# Patient Record
Sex: Female | Born: 1975 | Hispanic: No | Marital: Married | Smoking: Never smoker
Health system: Southern US, Community
[De-identification: ages and names within clinical notes are randomized; demographics above are authoritative.]

## PROBLEM LIST (undated history)

## (undated) DIAGNOSIS — N200 Calculus of kidney: Secondary | ICD-10-CM

## (undated) DIAGNOSIS — A419 Sepsis, unspecified organism: Secondary | ICD-10-CM

## (undated) HISTORY — PX: TONSILLECTOMY: SUR1361

## (undated) HISTORY — PX: URETER SURGERY: SHX823

---

## 2009-01-18 ENCOUNTER — Encounter: Payer: Self-pay | Admitting: Women's Health

## 2009-01-18 ENCOUNTER — Other Ambulatory Visit: Admission: RE | Admit: 2009-01-18 | Discharge: 2009-01-18 | Payer: Self-pay | Admitting: Gynecology

## 2009-01-18 ENCOUNTER — Ambulatory Visit: Payer: Self-pay | Admitting: Women's Health

## 2009-05-10 ENCOUNTER — Ambulatory Visit: Payer: Self-pay | Admitting: Women's Health

## 2015-07-22 ENCOUNTER — Ambulatory Visit: Payer: BLUE CROSS/BLUE SHIELD | Admitting: Pediatrics

## 2017-11-21 ENCOUNTER — Emergency Department (HOSPITAL_BASED_OUTPATIENT_CLINIC_OR_DEPARTMENT_OTHER): Payer: BC Managed Care – PPO

## 2017-11-21 ENCOUNTER — Other Ambulatory Visit: Payer: Self-pay

## 2017-11-21 ENCOUNTER — Encounter (HOSPITAL_BASED_OUTPATIENT_CLINIC_OR_DEPARTMENT_OTHER): Payer: Self-pay

## 2017-11-21 ENCOUNTER — Emergency Department (HOSPITAL_BASED_OUTPATIENT_CLINIC_OR_DEPARTMENT_OTHER)
Admission: EM | Admit: 2017-11-21 | Discharge: 2017-11-21 | Disposition: A | Discharge status: Home / Self Care | Payer: BC Managed Care – PPO | Attending: Emergency Medicine | Admitting: Emergency Medicine

## 2017-11-21 DIAGNOSIS — Y929 Unspecified place or not applicable: Secondary | ICD-10-CM | POA: Insufficient documentation

## 2017-11-21 DIAGNOSIS — Y939 Activity, unspecified: Secondary | ICD-10-CM | POA: Insufficient documentation

## 2017-11-21 DIAGNOSIS — Z79899 Other long term (current) drug therapy: Secondary | ICD-10-CM | POA: Insufficient documentation

## 2017-11-21 DIAGNOSIS — S60221A Contusion of right hand, initial encounter: Secondary | ICD-10-CM | POA: Diagnosis not present

## 2017-11-21 DIAGNOSIS — T07XXXA Unspecified multiple injuries, initial encounter: Secondary | ICD-10-CM

## 2017-11-21 DIAGNOSIS — S60222A Contusion of left hand, initial encounter: Secondary | ICD-10-CM | POA: Insufficient documentation

## 2017-11-21 DIAGNOSIS — S8001XA Contusion of right knee, initial encounter: Secondary | ICD-10-CM | POA: Insufficient documentation

## 2017-11-21 DIAGNOSIS — S8002XA Contusion of left knee, initial encounter: Secondary | ICD-10-CM | POA: Insufficient documentation

## 2017-11-21 DIAGNOSIS — Y999 Unspecified external cause status: Secondary | ICD-10-CM | POA: Insufficient documentation

## 2017-11-21 DIAGNOSIS — S6991XA Unspecified injury of right wrist, hand and finger(s), initial encounter: Secondary | ICD-10-CM | POA: Diagnosis present

## 2017-11-21 HISTORY — DX: Sepsis, unspecified organism: A41.9

## 2017-11-21 HISTORY — DX: Calculus of kidney: N20.0

## 2017-11-21 MED ORDER — CYCLOBENZAPRINE HCL 10 MG PO TABS: 10.0000 mg | ORAL_TABLET | Freq: Two times a day (BID) | ORAL | 0 refills | Status: AC | PRN

## 2017-11-21 MED ORDER — IBUPROFEN 600 MG PO TABS: 600.0000 mg | ORAL_TABLET | Freq: Four times a day (QID) | ORAL | 0 refills | Status: AC | PRN

## 2017-11-21 NOTE — ED Notes (Signed)
Assisted pt with removing ring off of the left fourth finger, gave ring back to pt
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
## 2017-11-21 NOTE — ED Notes (Signed)
Updated to delay
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
## 2019-02-21 ENCOUNTER — Other Ambulatory Visit: Payer: Self-pay | Admitting: Gynecology

## 2019-02-21 DIAGNOSIS — R928 Other abnormal and inconclusive findings on diagnostic imaging of breast: Secondary | ICD-10-CM

## 2019-02-28 ENCOUNTER — Other Ambulatory Visit: Payer: Self-pay

## 2019-02-28 ENCOUNTER — Ambulatory Visit: Payer: BC Managed Care – PPO

## 2019-02-28 ENCOUNTER — Ambulatory Visit
Admission: RE | Admit: 2019-02-28 | Discharge: 2019-02-28 | Disposition: A | Discharge status: Home / Self Care | Payer: BC Managed Care – PPO | Source: Ambulatory Visit | Attending: Gynecology | Admitting: Gynecology

## 2019-02-28 DIAGNOSIS — R928 Other abnormal and inconclusive findings on diagnostic imaging of breast: Secondary | ICD-10-CM

## 2020-09-17 IMAGING — [facility] DIGITAL DIAGNOSTIC UNILATERAL LEFT MAMMOGRAM WITH TOMO AND CAD
8 series · 9 of 24 positions shown · non-contrast
Comparison: Previous exam(s).

CLINICAL DATA: The patient was called back for left breast
asymmetry.

EXAM:
DIGITAL DIAGNOSTIC UNILATERAL LEFT MAMMOGRAM WITH CAD AND TOMO

[L MLO synth-2D (1 of 2)]
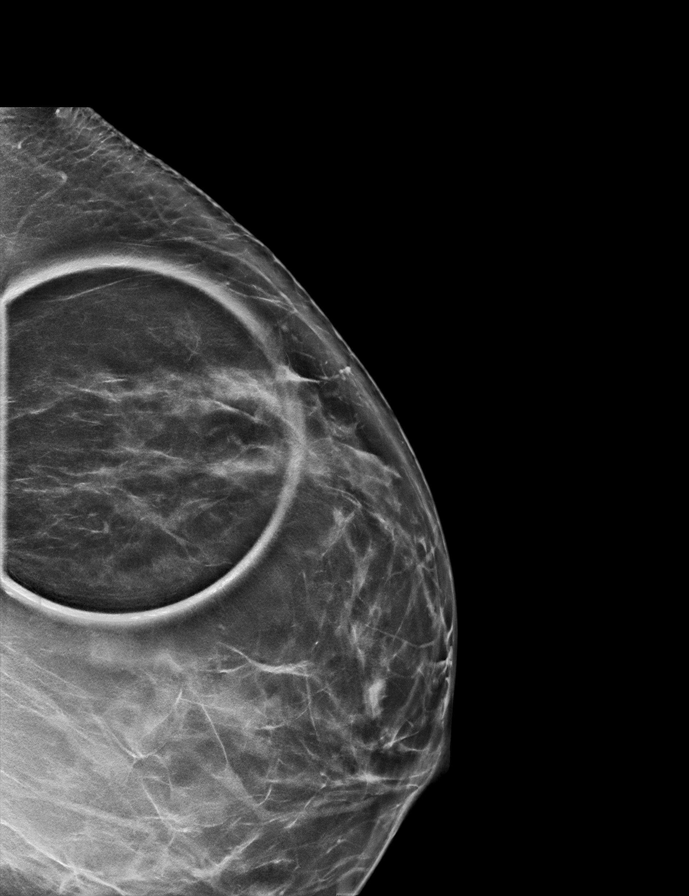

[L MLO synth-2D (2 of 2)]
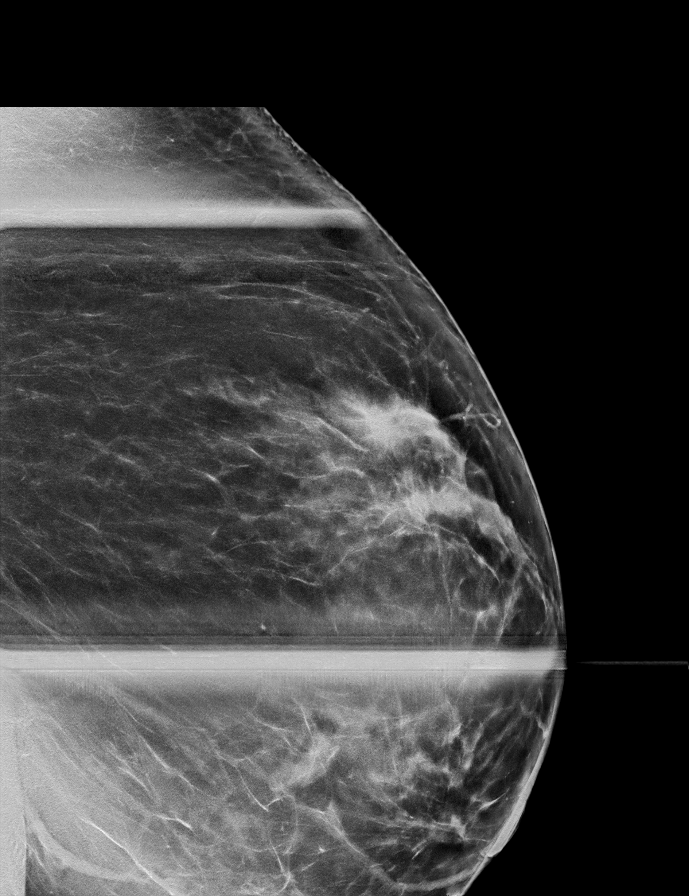

[L CC synth-2D (1 of 2)]
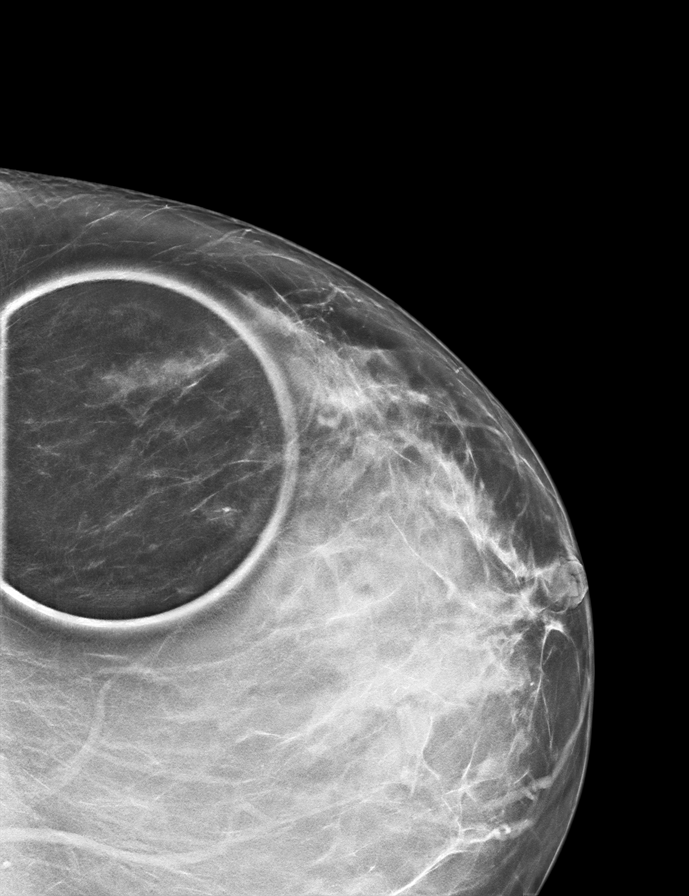

[L CC synth-2D (2 of 2)]
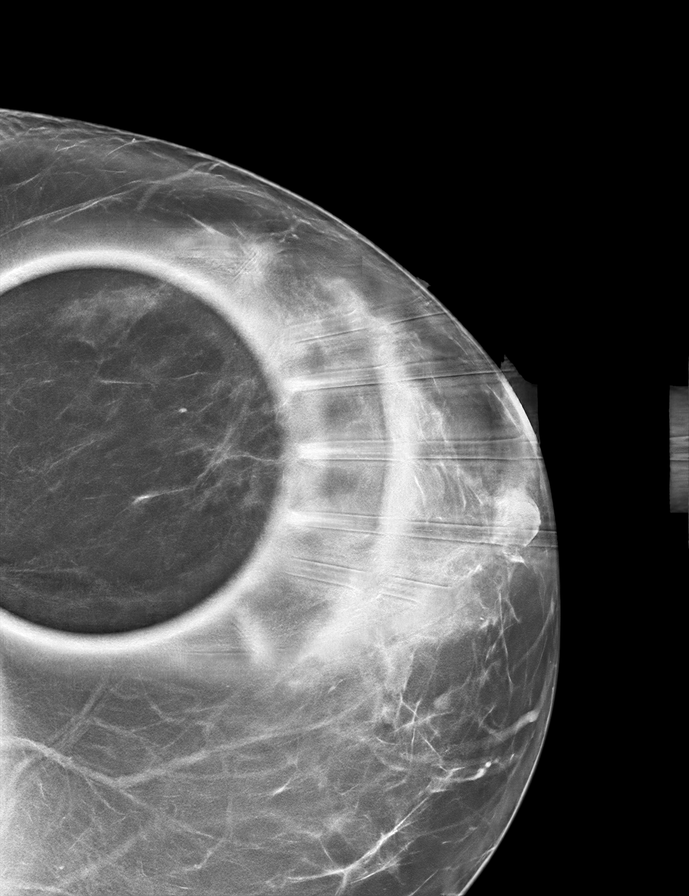

[L MLO tomo · 2 of 87 frames shown (1 of 2)]
[frame 29/87]
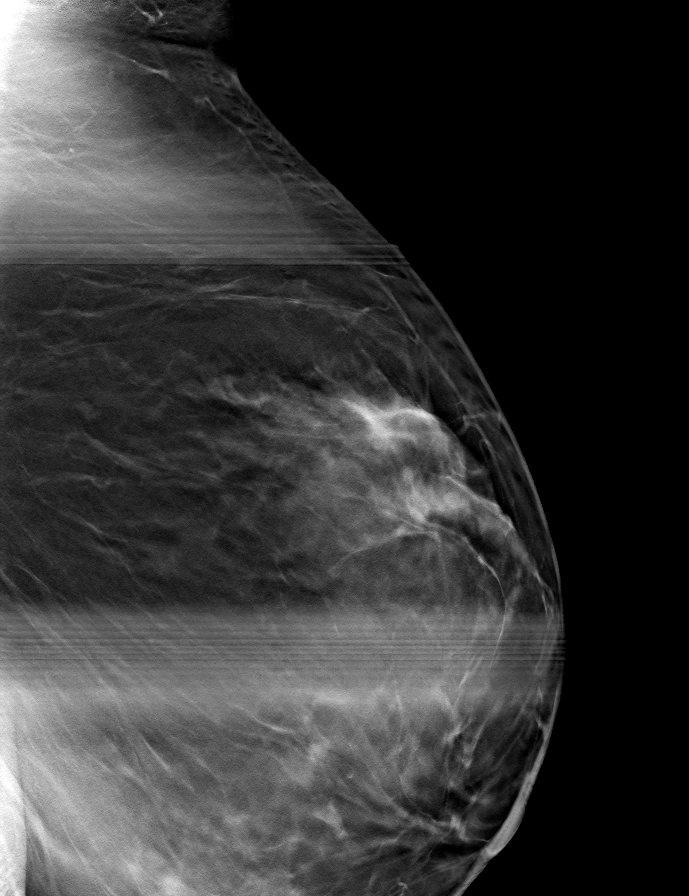
[frame 44/87]
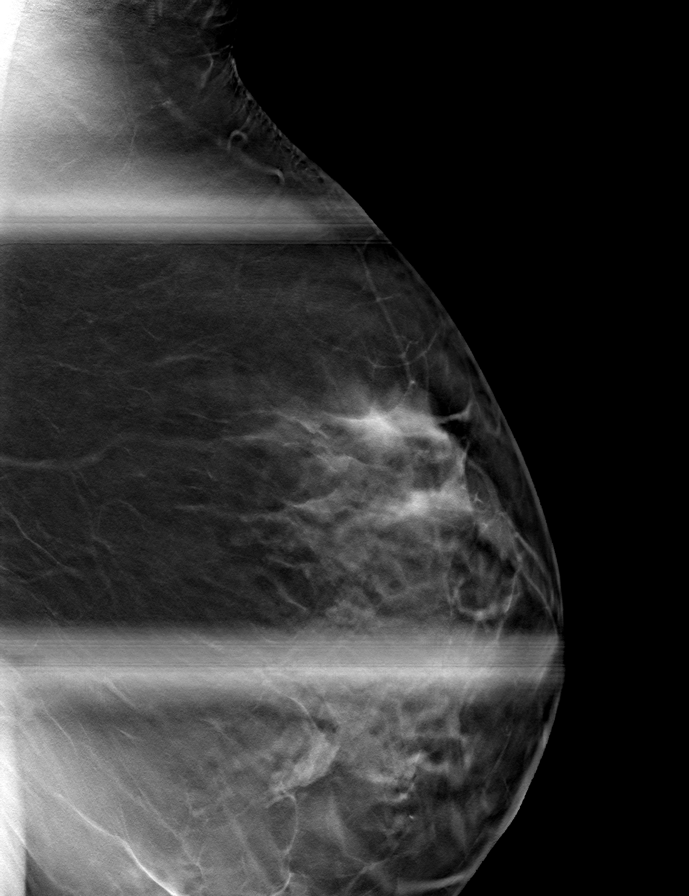

[L MLO tomo (2 of 2) · tomo slice 44/87.0]
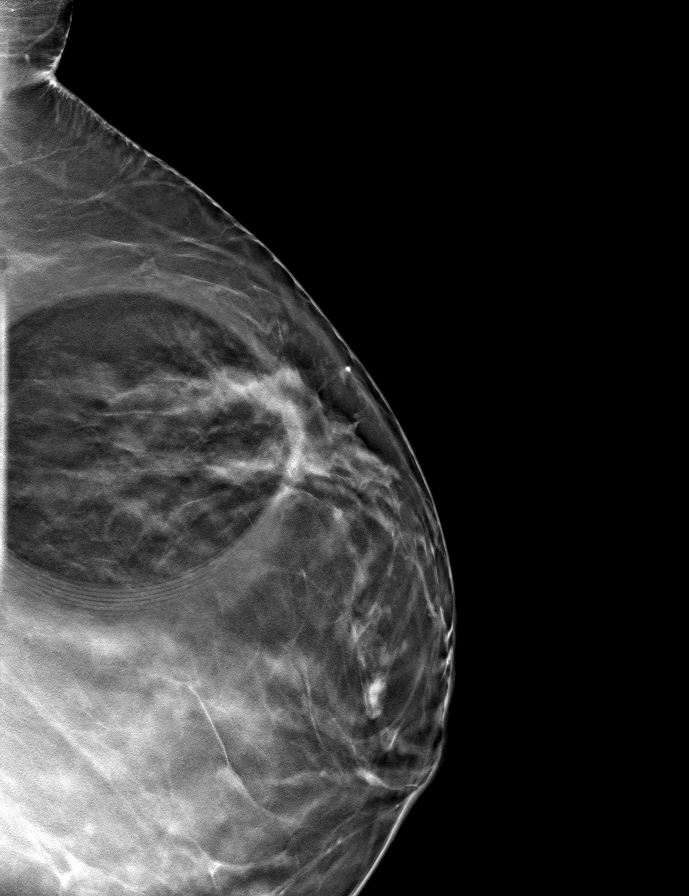

[L CC tomo (1 of 2) · tomo slice 41/82.0]
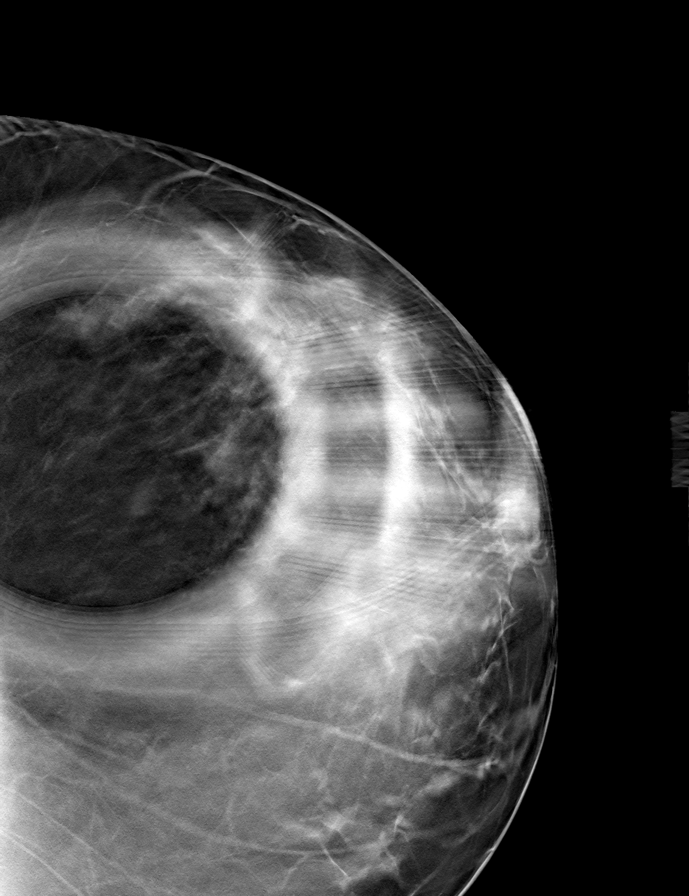

[L CC tomo (2 of 2) · tomo slice 39/76.0]
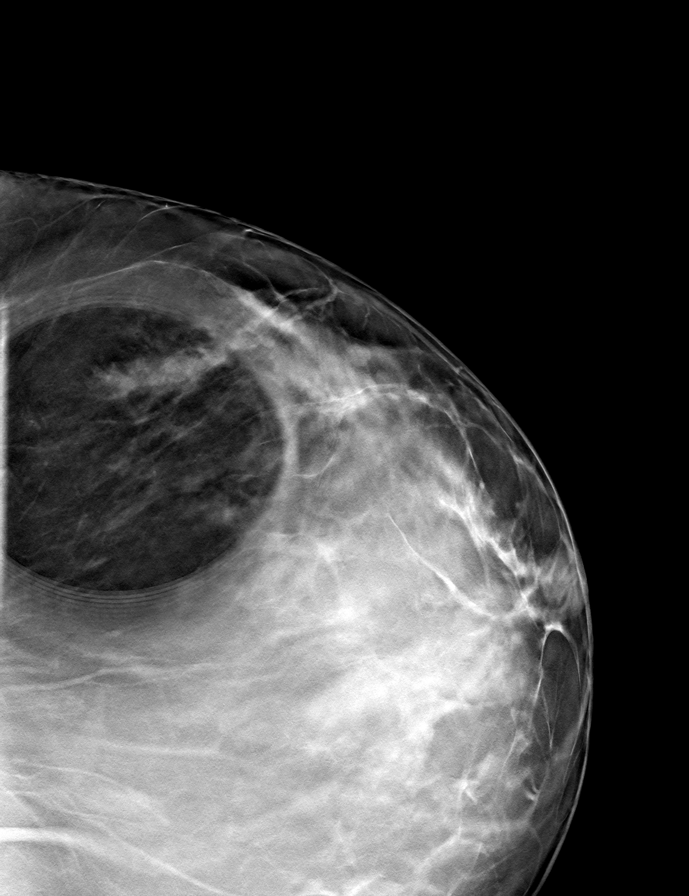

[9 of 24 positions shown; findings below may reference images not displayed]

ACR Breast Density Category c: The breast tissue is heterogeneously
dense, which may obscure small masses.
FINDINGS: The asymmetry resolves into glandular tissue on today's study. No
suspicious findings.

Mammographic images were processed with CAD.
IMPRESSION: No mammographic evidence of malignancy.

RECOMMENDATION:
Annual screening mammography.

I have discussed the findings and recommendations with the patient.
Results were also provided in writing at the conclusion of the
visit. If applicable, a reminder letter will be sent to the patient
regarding the next appointment.

BI-RADS CATEGORY  2: Benign.
